# Patient Record
Sex: Female | Born: 1960 | Race: White | Hispanic: No | Marital: Married | Smoking: Never smoker
Health system: Southern US, Community
[De-identification: ages and names within clinical notes are randomized; demographics above are authoritative.]

## PROBLEM LIST (undated history)

## (undated) DIAGNOSIS — E119 Type 2 diabetes mellitus without complications: Secondary | ICD-10-CM

## (undated) DIAGNOSIS — K861 Other chronic pancreatitis: Secondary | ICD-10-CM

## (undated) DIAGNOSIS — F419 Anxiety disorder, unspecified: Secondary | ICD-10-CM

## (undated) DIAGNOSIS — F32A Depression, unspecified: Secondary | ICD-10-CM

## (undated) DIAGNOSIS — I10 Essential (primary) hypertension: Secondary | ICD-10-CM

## (undated) HISTORY — PX: TUBAL LIGATION: SHX77

## (undated) HISTORY — PX: CHOLECYSTECTOMY: SHX55

## (undated) HISTORY — PX: GASTRIC BYPASS: SHX52

## (undated) HISTORY — PX: CARPAL TUNNEL RELEASE: SHX101

---

## 2005-08-28 ENCOUNTER — Emergency Department: Payer: Self-pay | Admitting: Emergency Medicine

## 2005-09-04 ENCOUNTER — Emergency Department: Payer: Self-pay | Admitting: Emergency Medicine

## 2005-09-06 ENCOUNTER — Emergency Department: Payer: Self-pay | Admitting: Emergency Medicine

## 2020-07-11 ENCOUNTER — Ambulatory Visit (INDEPENDENT_AMBULATORY_CARE_PROVIDER_SITE_OTHER): Payer: No Typology Code available for payment source

## 2020-07-11 ENCOUNTER — Ambulatory Visit
Admission: EM | Admit: 2020-07-11 | Discharge: 2020-07-11 | Disposition: A | Discharge status: Home / Self Care | Payer: No Typology Code available for payment source | Attending: Physician Assistant | Admitting: Physician Assistant

## 2020-07-11 ENCOUNTER — Encounter: Payer: Self-pay | Admitting: Emergency Medicine

## 2020-07-11 ENCOUNTER — Other Ambulatory Visit: Payer: Self-pay

## 2020-07-11 DIAGNOSIS — M5441 Lumbago with sciatica, right side: Secondary | ICD-10-CM

## 2020-07-11 DIAGNOSIS — S3992XA Unspecified injury of lower back, initial encounter: Secondary | ICD-10-CM | POA: Diagnosis not present

## 2020-07-11 DIAGNOSIS — W19XXXA Unspecified fall, initial encounter: Secondary | ICD-10-CM

## 2020-07-11 DIAGNOSIS — M545 Low back pain, unspecified: Secondary | ICD-10-CM

## 2020-07-11 HISTORY — DX: Other chronic pancreatitis: K86.1

## 2020-07-11 HISTORY — DX: Depression, unspecified: F32.A

## 2020-07-11 HISTORY — DX: Type 2 diabetes mellitus without complications: E11.9

## 2020-07-11 HISTORY — DX: Essential (primary) hypertension: I10

## 2020-07-11 HISTORY — DX: Anxiety disorder, unspecified: F41.9

## 2020-07-11 MED ORDER — CYCLOBENZAPRINE HCL 10 MG PO TABS: 10.0000 mg | ORAL_TABLET | Freq: Three times a day (TID) | ORAL | 0 refills | Status: AC | PRN

## 2020-07-11 MED ORDER — MELOXICAM 7.5 MG PO TABS: 7.5000 mg | ORAL_TABLET | Freq: Every day | ORAL | 0 refills | Status: AC

## 2020-07-11 NOTE — ED Triage Notes (Signed)
Patient in today c/o back pain (lower middle) and right leg pain x 5 days. Patient states her back feels numb and she is having trouble putting weight on her right leg and difficulty straightening her leg out. Patient denies any radiating pain from her back to her leg. Patient has been using OTC Tylenol without relief. Patient states she fell ~2 weeks ago down 3 steps landing on her back.

## 2020-07-11 NOTE — ED Provider Notes (Signed)
[facility]    CSN: [id number] Arrival date & time: 07/11/20  1058      History   Chief Complaint Chief Complaint  Patient presents with  . Appointment  . Back Pain    Lower middle  . Leg Pain    right    HPI Amanda Lamb is a 60 y.o. female presenting for 5 day history of midline lower back pain and numbness, right posterior and lateral leg pain, and right great toe numbness. She reports slip and fall onto back 2 weeks ago. Has taken OTC Tylenol without relief or improvement in pain.  Patient states she has had problems with her sciatic nerve in the past but does not believe that her current symptoms are consistent with that.  She states that she thought her knee was hurting but then realized it was more of her leg from the knee.  Patient admits to increased pain in the leg and back whenever she extends any intrinsic leg out.  She also has some pain whenever she extends her back.  Pain is worse when she lays on her back.  She has not found anything to relieve the pain.  Pain worse with position changes and going from sitting to standing.  Denies any weakness.  No saddle anesthesia, bowel or bladder incontinence or leg weakness.  No other complaints or concerns.  HPI  Past Medical History:  Diagnosis Date  . Anxiety   . Chronic pancreatitis (HCC)   . Depression   . Diabetes mellitus without complication (HCC)   . Hypertension     There are no problems to display for this patient.   Past Surgical History:  Procedure Laterality Date  . CARPAL TUNNEL RELEASE Bilateral   . CESAREAN SECTION    . CHOLECYSTECTOMY    . GASTRIC BYPASS    . TUBAL LIGATION      OB History   No obstetric history on file.      Home Medications    Prior to Admission medications   Medication Sig Start Date End Date Taking? Authorizing Provider  ADMELOG SOLOSTAR 100 UNIT/ML KwikPen Inject into the skin. 06/16/20  Yes [provider]  ARIPiprazole (ABILIFY) 10 MG tablet  Take 10 mg by mouth daily. 07/06/20  Yes [provider]  atenolol (TENORMIN) 25 MG tablet Take 25 mg by mouth daily. 06/07/20  Yes [provider]  CREON 36000-114000 units CPEP capsule Take 72,000 Units by mouth 4 (four) times daily. 05/12/20  Yes [provider]  cyclobenzaprine (FLEXERIL) 10 MG tablet Take 1 tablet (10 mg total) by mouth 3 (three) times daily as needed for up to 10 days for muscle spasms. 07/11/20 07/21/20 Yes Shirlee Latch, PA-C  DULoxetine (CYMBALTA) 60 MG capsule Take 80 mg by mouth 2 (two) times daily. 07/06/20  Yes [provider]  famotidine (PEPCID) 20 MG tablet Take 20 mg by mouth 2 (two) times daily. 05/12/20  Yes [provider]  FEROSUL 325 (65 Fe) MG tablet Take 325 mg by mouth every morning. 05/12/20  Yes [provider]  furosemide (LASIX) 20 MG tablet Take 20 mg by mouth daily. 06/30/20  Yes [provider]  hydrALAZINE (APRESOLINE) 25 MG tablet Take 25 mg by mouth 2 (two) times daily. 06/30/20  Yes [provider]  insulin glargine (SEMGLEE) 100 UNIT/ML Solostar Pen INJECT 25 UNITS SUBCUTANEOUSLY NIGHTLY 05/14/20  Yes [provider]  meloxicam (MOBIC) 7.5 MG tablet Take 1 tablet (7.5 mg total) by  mouth daily. 07/11/20 08/10/20 Yes Eusebio FriendlyEaves, Noel Rodier B, PA-C  mirtazapine (REMERON) 30 MG tablet Take 30 mg by mouth at bedtime. 06/30/20  Yes [provider]  olmesartan (BENICAR) 40 MG tablet Take 40 mg by mouth daily. 07/05/20  Yes [provider]  pantoprazole (PROTONIX) 40 MG tablet Take 40 mg by mouth daily. 07/05/20  Yes [provider]  potassium chloride SA (KLOR-CON) 20 MEQ tablet Take 2 tablets by mouth 2 (two) times daily. 05/12/20  Yes [provider]  rOPINIRole (REQUIP) 0.5 MG tablet Take 0.5 mg by mouth 2 (two) times daily. 07/05/20  Yes [provider]  TRULICITY 1.5 MG/0.5ML SOPN Inject into the skin. 07/09/20  Yes [provider]     Family History Family History  Problem Relation Age of Onset  . Hypertension Mother   . Atrial fibrillation Mother   . Diabetes Mother   . Other Father        rare blood disease  . Heart disease Father   . Hypertension Father   . Atrial fibrillation Father     Social History Social History   Tobacco Use  . Smoking status: Never Smoker  . Smokeless tobacco: Never Used  Vaping Use  . Vaping Use: Never used  Substance Use Topics  . Alcohol use: Never  . Drug use: Never     Allergies   Gluten meal, Indomethacin, Lactose intolerance (gi), Lamotrigine, Lisinopril, Metformin hcl, Oxycodone, Oxycodone-acetaminophen, Oxytocin, Silver, Sulfa antibiotics, and Tape   Review of Systems Review of Systems  Constitutional: Negative for fatigue and fever.  Musculoskeletal: Positive for back pain. Negative for joint swelling and myalgias.  Skin: Negative for wound.  Neurological: Positive for numbness. Negative for weakness.  Hematological: Does not bruise/bleed easily.     Physical Exam Triage Vital Signs ED Triage Vitals  Enc Vitals Group     BP 07/11/20 1139 (!) 146/75     Pulse Rate 07/11/20 1139 71     Resp 07/11/20 1139 18     Temp 07/11/20 1139 98.3 F (36.8 C)     Temp Source 07/11/20 1139 Oral     SpO2 07/11/20 1139 99 %     Weight 07/11/20 1139 260 lb (117.9 kg)     Height 07/11/20 1139 5\' 7"  (1.702 m)     Head Circumference --      Peak Flow --      Pain Score 07/11/20 1138 9     Pain Loc --      Pain Edu? --      Excl. in GC? --    No data found.  Updated Vital Signs BP (!) 146/75 (BP Location: Left Arm)   Pulse 71   Temp 98.3 F (36.8 C) (Oral)   Resp 18   Ht 5\' 7"  (1.702 m)   Wt 260 lb (117.9 kg)   SpO2 99%   BMI 40.72 kg/m    Physical Exam Vitals and nursing note reviewed.  Constitutional:      General: She is not in acute distress.    Appearance: Normal appearance. She is obese. She is not ill-appearing or toxic-appearing.  HENT:      Head: Normocephalic and atraumatic.  Eyes:     General: No scleral icterus.       Right eye: No discharge.        Left eye: No discharge.     Conjunctiva/sclera: Conjunctivae normal.  Cardiovascular:     Rate and Rhythm: Normal rate and regular  rhythm.     Heart sounds: Normal heart sounds.  Pulmonary:     Effort: Pulmonary effort is normal. No respiratory distress.     Breath sounds: Normal breath sounds.  Musculoskeletal:     Cervical back: Neck supple.     Lumbar back: Spasms and bony tenderness (L4-S1) present. Decreased range of motion (decreased extension of back). Positive right straight leg raise test. Negative left straight leg raise test.  Skin:    General: Skin is dry.  Neurological:     General: No focal deficit present.     Mental Status: She is alert. Mental status is at baseline.     Motor: No weakness.     Gait: Gait normal.  Psychiatric:        Mood and Affect: Mood normal.        Behavior: Behavior normal.        Thought Content: Thought content normal.      UC Treatments / Results  Labs (all labs ordered are listed, but only abnormal results are displayed) Labs Reviewed - No data to display  EKG   Radiology DG Lumbar Spine Complete  Result Date: 07/11/2020 CLINICAL DATA:  Status post fall with back pain. EXAM: LUMBAR SPINE - COMPLETE 4+ VIEW COMPARISON:  None. FINDINGS: There is no evidence of lumbar spine fracture. Alignment is normal. Degenerative joint changes of the spine are noted. IMPRESSION: No acute fracture or dislocation. Electronically Signed   By: Sherian Rein M.D.   On: 07/11/2020 12:17    Procedures Procedures (including critical care time)  Medications Ordered in UC Medications - No data to display  Initial Impression / Assessment and Plan / UC Course  I have reviewed the triage vital signs and the nursing notes.  Pertinent labs & imaging results that were available during my care of the patient were reviewed by me and  considered in my medical decision making (see chart for details).  X-ray of L-spine obtained today since patient does report traumatic fall couple weeks ago prior to onset of pain.  X-rays negative for any fractures or acute abnormality.  It does show some degenerative joint changes throughout the spine.  Discussed results with patient.  Advised patient I suspect herniated or bulging disc with lumbar radiculopathy.  Treating at this time with supportive care and meloxicam 7.5 mg tablet daily, Tylenol daily as needed for pain relief, and cyclobenzaprine 3 times daily as needed for muscle spasms and pain.  Patient says she has taken cyclobenzaprine in the past and has done well with that.  Patient given contact information for orthopedics in case symptoms worsen.  I reviewed ED precautions with patient.   Final Clinical Impressions(s) / UC Diagnoses   Final diagnoses:  Acute midline low back pain with right-sided sciatica  Injury of back, initial encounter     Discharge Instructions     The back x-ray is normal.  BACK PAIN: Stressed avoiding painful activities . RICE (REST, ICE, COMPRESSION, ELEVATION) guidelines reviewed. May alternate ice and heat. Consider use of muscle rubs, Salonpas patches, etc. Use medications as directed including muscle relaxers if prescribed. Take anti-inflammatory medications as prescribed or OTC NSAIDs/Tylenol.  F/u with PCP in 7-10 days for reexamination, and please feel free to call or return to the urgent care at any time for any questions or concerns you may have and we will be happy to help you!   BACK PAIN RED FLAGS: If the back pain acutely worsens or there are any  red flag symptoms such as numbness/tingling, leg weakness, saddle anesthesia, or loss of bowel/bladder control, go immediately to the ER. Follow up with [place] as scheduled or sooner if the pain does not begin to resolve or if it worsens before the follow up    You have a condition requiring you to follow  up with Orthopedics so please call one of the following office for appointment:   [facility] [address] [place], [place] [address] Phone: [phone]  [facility] [address] [place], [place] [address] Phone: [phone]     ED Prescriptions    Medication Sig Dispense Auth. Provider   meloxicam (MOBIC) 7.5 MG tablet Take 1 tablet (7.5 mg total) by mouth daily. 30 tablet Eusebio Friendly B, PA-C   cyclobenzaprine (FLEXERIL) 10 MG tablet Take 1 tablet (10 mg total) by mouth 3 (three) times daily as needed for up to 10 days for muscle spasms. 20 tablet Shirlee Latch, PA-C     I have reviewed the PDMP during this encounter.   Shirlee Latch, PA-C 07/11/20 1311

## 2020-07-11 NOTE — Discharge Instructions (Addendum)
The back x-ray is normal.  BACK PAIN: Stressed avoiding painful activities . RICE (REST, ICE, COMPRESSION, ELEVATION) guidelines reviewed. May alternate ice and heat. Consider use of muscle rubs, Salonpas patches, etc. Use medications as directed including muscle relaxers if prescribed. Take anti-inflammatory medications as prescribed or OTC NSAIDs/Tylenol.  F/u with PCP in 7-10 days for reexamination, and please feel free to call or return to the urgent care at any time for any questions or concerns you may have and we will be happy to help you!   BACK PAIN RED FLAGS: If the back pain acutely worsens or there are any red flag symptoms such as numbness/tingling, leg weakness, saddle anesthesia, or loss of bowel/bladder control, go immediately to the ER. Follow up with [place] as scheduled or sooner if the pain does not begin to resolve or if it worsens before the follow up    You have a condition requiring you to follow up with Orthopedics so please call one of the following office for appointment:   [facility] [address] [place], [place] [address] Phone: [phone]  [facility] [address] [place], [place] [address] Phone: [phone]

## 2021-10-21 IMAGING — CR DG LUMBAR SPINE COMPLETE 4+V
5 series · 5 of 5 positions shown · non-contrast
Comparison: None.

CLINICAL DATA: Status post fall with back pain.

EXAM:
LUMBAR SPINE - COMPLETE 4+ VIEW

[l-spine ap]
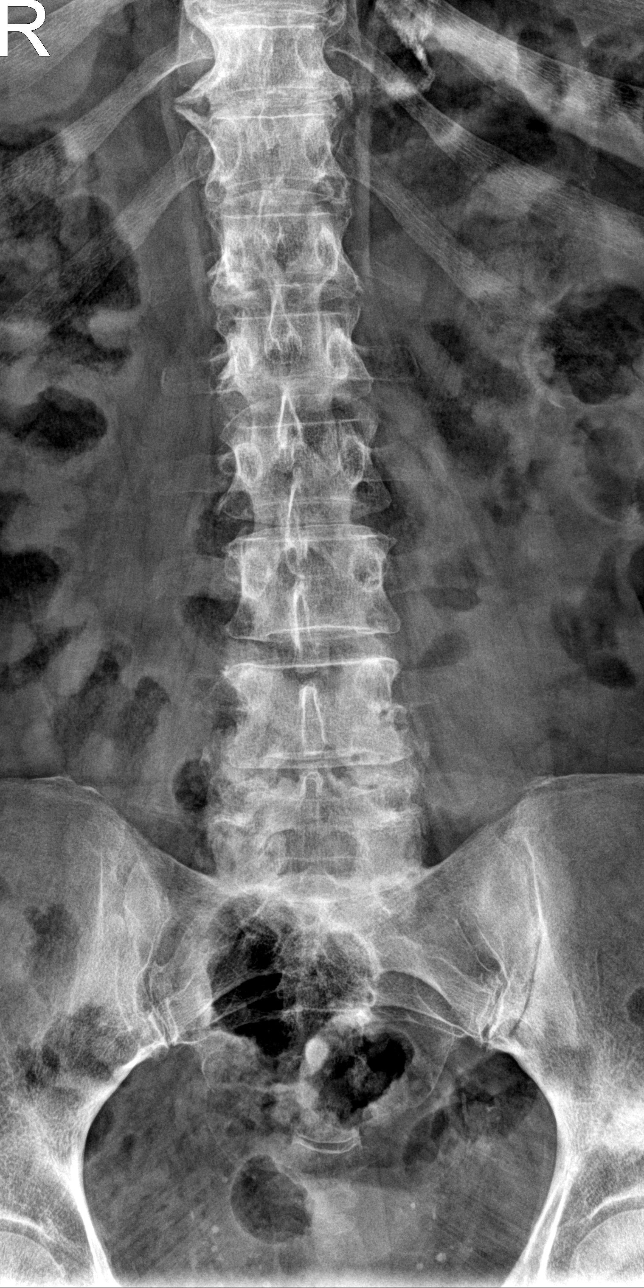

[l-spine obl (1 of 2)]
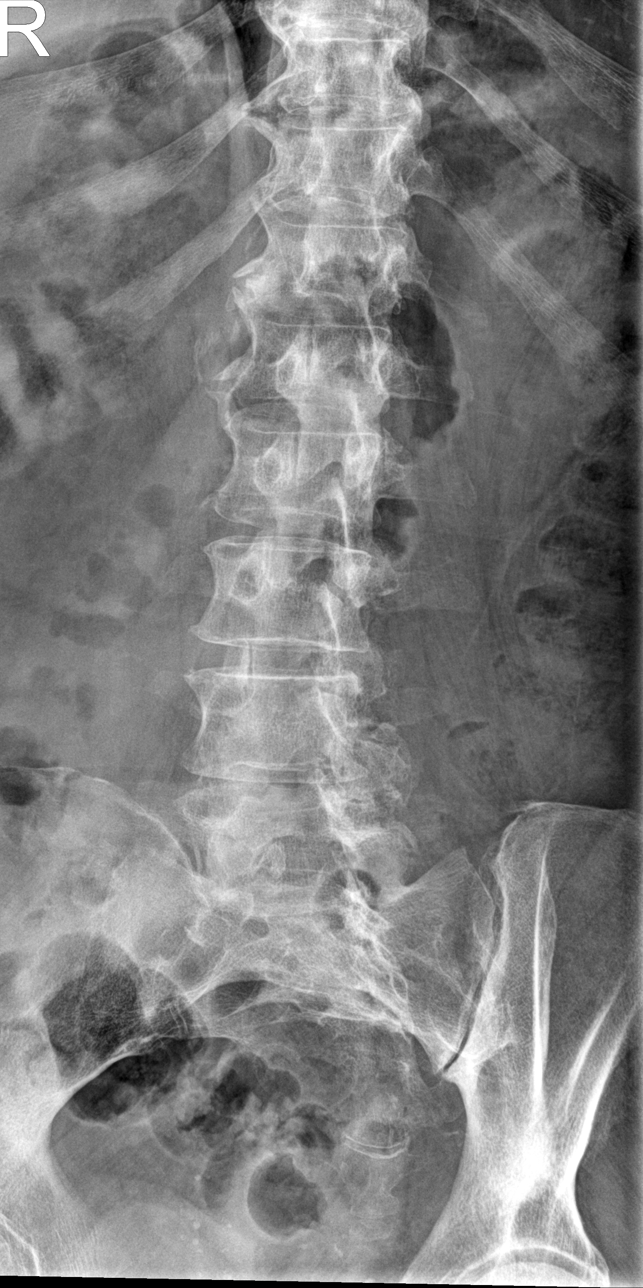

[l-spine obl (2 of 2)]
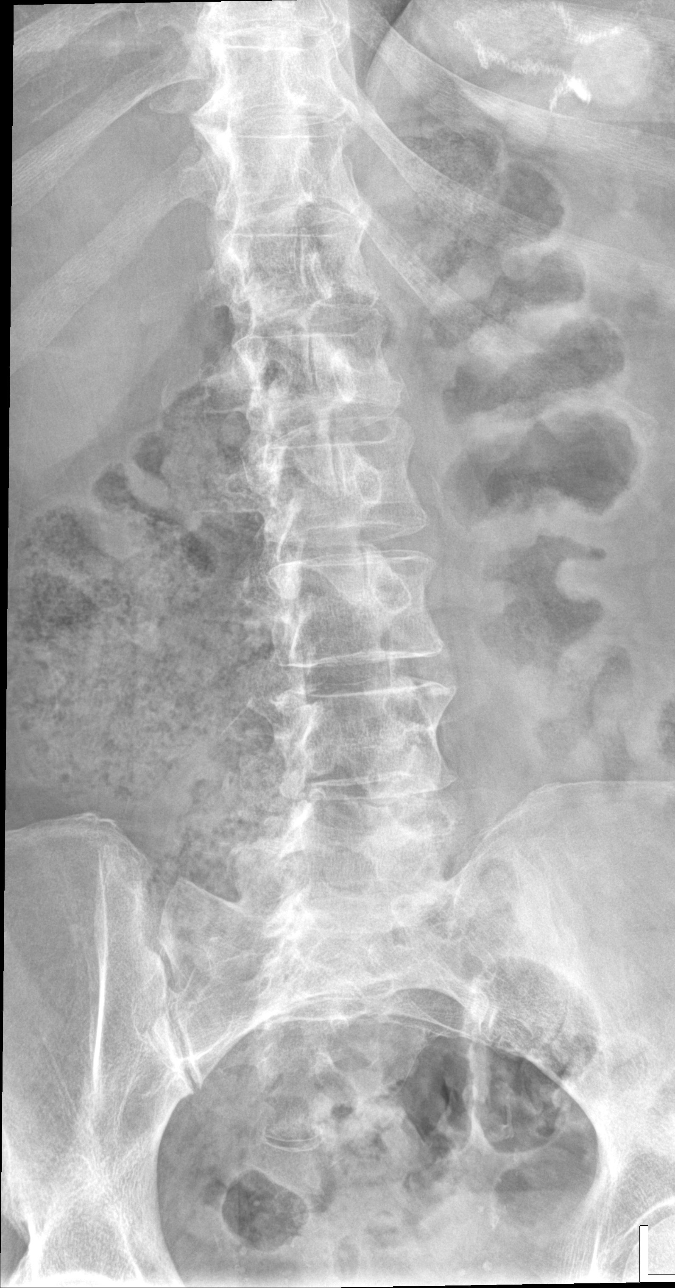

[l-spine lat]
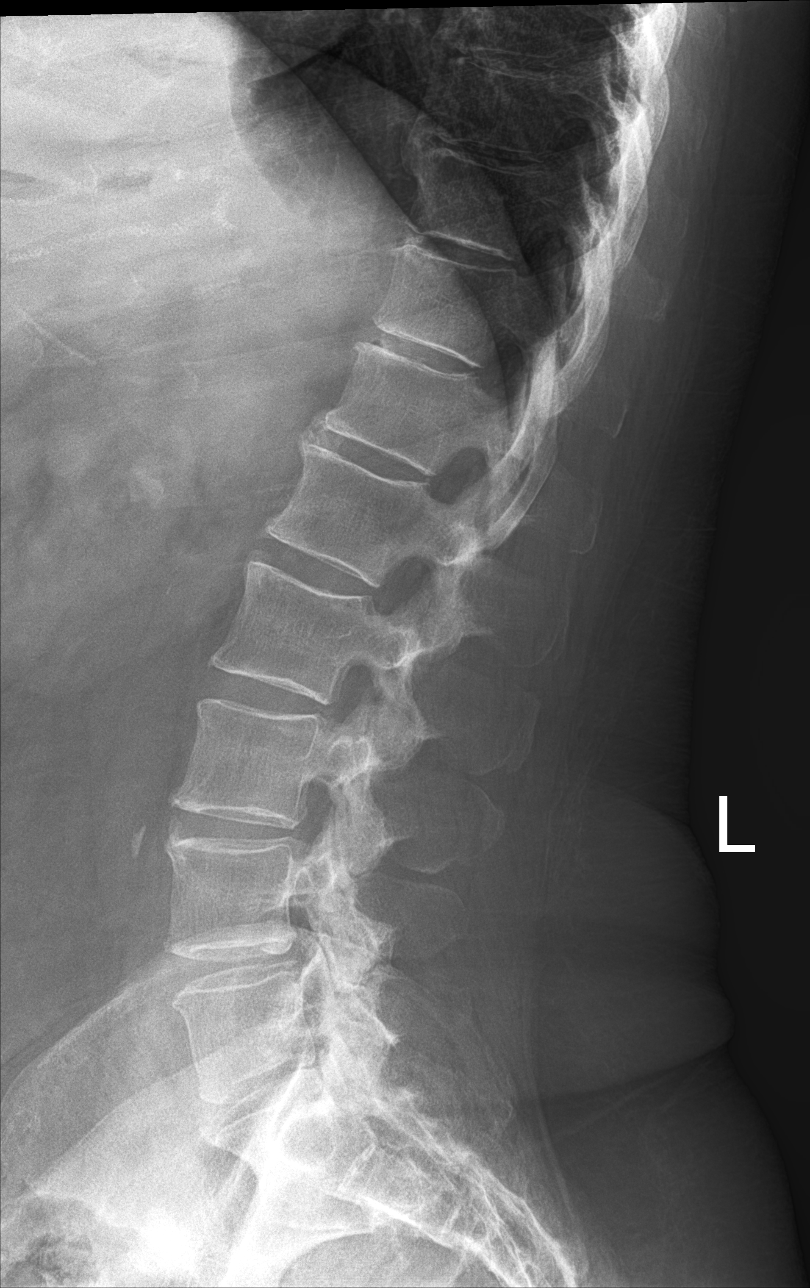

[l-spine spot]
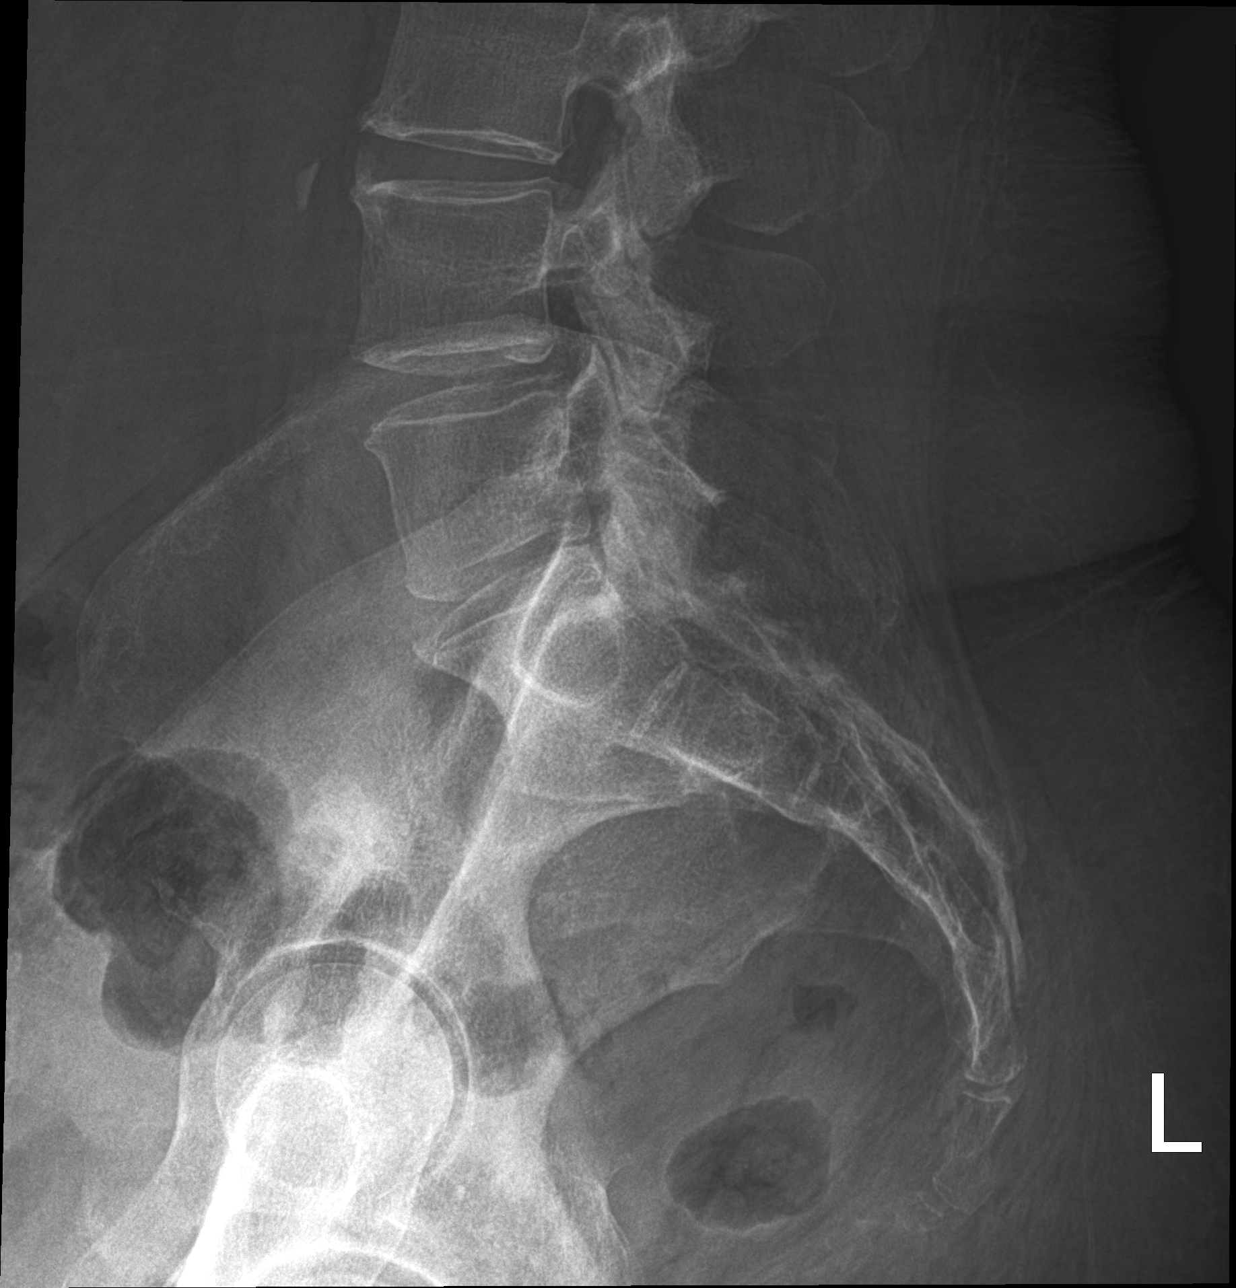

[5 of 5 positions shown; findings below may reference images not displayed]

FINDINGS: There is no evidence of lumbar spine fracture. Alignment is normal.
Degenerative joint changes of the spine are noted.
IMPRESSION: No acute fracture or dislocation.

## 2022-04-13 ENCOUNTER — Ambulatory Visit
Admission: RE | Admit: 2022-04-13 | Discharge: 2022-04-13 | Disposition: A | Discharge status: Home / Self Care | Payer: No Typology Code available for payment source | Source: Ambulatory Visit

## 2022-04-13 ENCOUNTER — Ambulatory Visit: Payer: No Typology Code available for payment source

## 2022-04-13 VITALS — BP 162/90 | HR 80 | Temp 98.8°F | Ht 67.0 in | Wt 260.0 lb

## 2022-04-13 DIAGNOSIS — R310 Gross hematuria: Secondary | ICD-10-CM

## 2022-04-13 DIAGNOSIS — R109 Unspecified abdominal pain: Secondary | ICD-10-CM | POA: Diagnosis not present

## 2022-04-13 DIAGNOSIS — M545 Low back pain, unspecified: Secondary | ICD-10-CM

## 2022-04-13 DIAGNOSIS — N390 Urinary tract infection, site not specified: Secondary | ICD-10-CM | POA: Diagnosis not present

## 2022-04-13 LAB — URINALYSIS, ROUTINE W REFLEX MICROSCOPIC
Bilirubin Urine: NEGATIVE
Glucose, UA: NEGATIVE mg/dL
Hgb urine dipstick: NEGATIVE
Ketones, ur: NEGATIVE mg/dL
Nitrite: NEGATIVE
Protein, ur: NEGATIVE mg/dL
Specific Gravity, Urine: <1.005 — ABNORMAL LOW (ref 1.005–1.030)
pH: 5.5 (ref 5.0–8.0)

## 2022-04-13 LAB — URINALYSIS, MICROSCOPIC (REFLEX)

## 2022-04-13 MED ORDER — PHENAZOPYRIDINE HCL 200 MG PO TABS: 200.0000 mg | ORAL_TABLET | Freq: Three times a day (TID) | ORAL | 0 refills | Status: AC

## 2022-04-13 MED ORDER — CEFDINIR 300 MG PO CAPS: 300.0000 mg | ORAL_CAPSULE | Freq: Two times a day (BID) | ORAL | 0 refills | Status: AC

## 2022-04-13 MED ORDER — BACLOFEN 10 MG PO TABS: 10.0000 mg | ORAL_TABLET | Freq: Three times a day (TID) | ORAL | 0 refills | Status: AC

## 2022-04-13 NOTE — ED Triage Notes (Signed)
Pt c/o RT lower back pain x couple months, pt denies any fall or injury

## 2022-04-13 NOTE — Discharge Instructions (Addendum)
Take the cefdinir twice daily for 7 days with food for treatment of urinary tract infection.  Use the Pyridium every 8 hours as needed for urinary discomfort.  This will turn your urine a bright red-orange.  Increase your oral fluid intake so that you increase your urine production and or flushing your urinary system.  Take an over-the-counter probiotic, such as Culturelle-Align-Activia, 1 hour after each dose of antibiotic to prevent diarrhea or yeast infections from forming.  We will culture urine and change the antibiotics if necessary.  There may also be a musculoskeletal component considering her back pain started multiple weeks before your urinary symptoms.  Take Tylenol 1000 mg every 6 hours to help with the pain.  Take the baclofen every 8 hours to help with muscle pain.  Return for reevaluation, or see your primary care provider, for any new or worsening symptoms.

## 2022-04-13 NOTE — ED Provider Notes (Signed)
[facility]    CSN: [id number] Arrival date & time: 04/13/22  1851      History   Chief Complaint Chief Complaint  Patient presents with   Back Pain    HPI Amanda Lamb is a 61 y.o. female.   HPI  61 year old female here for evaluation of right flank pain.  Patient reports that she has been experiencing pain in her right flank and the right side of her back for the past 2 months.  The pain does increase at nighttime and also increases with movement.  She is also been experiencing burning with urination and urinary urgency and frequency for the past 2 weeks.  Today she noticed some blood in her urine.  This is associated with nausea when the pain is severe as well as some sweating.  She denies any injury or heavy lifting.  The pain does not radiate into her legs at all.  She has no history of kidney stones.  She is a resident of [place] and she is currently in town visiting her mother.  Patient has a significant past medical history to include hypertension, diabetes, depression, anxiety, and chronic pancreatitis.  Past Medical History:  Diagnosis Date   Anxiety    Chronic pancreatitis ([place])    Depression    Diabetes mellitus without complication ([place])    Hypertension     There are no problems to display for this patient.   Past Surgical History:  Procedure Laterality Date   CARPAL TUNNEL RELEASE Bilateral    CESAREAN SECTION     CHOLECYSTECTOMY     GASTRIC BYPASS     TUBAL LIGATION      OB History   No obstetric history on file.      Home Medications    Prior to Admission medications   Medication Sig Start Date End Date Taking? Authorizing Provider  ADMELOG SOLOSTAR 100 UNIT/ML KwikPen Inject into the skin. 06/16/20  Yes [provider]  albuterol (VENTOLIN HFA) 108 (90 Base) MCG/ACT inhaler Inhale into the lungs. 06/14/21  Yes [provider]  baclofen (LIORESAL) 10 MG tablet Take 1 tablet (10 mg total) by mouth 3 (three)  times daily. 04/13/22  Yes Margarette Canada, NP  cefdinir (OMNICEF) 300 MG capsule Take 1 capsule (300 mg total) by mouth 2 (two) times daily for 7 days. 04/13/22 04/20/22 Yes Margarette Canada, NP  DULoxetine (CYMBALTA) 60 MG capsule Take by mouth. 11/12/13  Yes [provider]  hydrALAZINE (APRESOLINE) 25 MG tablet Take 1 tablet by mouth 2 (two) times daily. 06/14/21  Yes [provider]  levothyroxine (SYNTHROID) 50 MCG tablet Take 1 tablet by mouth daily. 03/15/22  Yes [provider]  olmesartan (BENICAR) 40 MG tablet Take 1 tablet by mouth daily. 06/14/21  Yes [provider]  phenazopyridine (PYRIDIUM) 200 MG tablet Take 1 tablet (200 mg total) by mouth 3 (three) times daily. 04/13/22  Yes Margarette Canada, NP  ARIPiprazole (ABILIFY) 10 MG tablet Take 10 mg by mouth daily. 07/06/20   [provider]  atenolol (TENORMIN) 25 MG tablet Take 25 mg by mouth daily. 06/07/20   [provider]  CREON 36000-114000 units CPEP capsule Take 72,000 Units by mouth 4 (four) times daily. 05/12/20   [provider]  DULoxetine (CYMBALTA) 60 MG capsule Take 80 mg by mouth 2 (two) times daily. 07/06/20   [provider]  famotidine (PEPCID) 20 MG tablet Take 20 mg by mouth 2 (two) times daily. 05/12/20  [provider]  FEROSUL 325 (65 Fe) MG tablet Take 325 mg by mouth every morning. 05/12/20   [provider]  furosemide (LASIX) 20 MG tablet Take 20 mg by mouth daily. 06/30/20   [provider]  hydrALAZINE (APRESOLINE) 25 MG tablet Take 25 mg by mouth 2 (two) times daily. 06/30/20   [provider]  insulin glargine (SEMGLEE) 100 UNIT/ML Solostar Pen INJECT 25 UNITS SUBCUTANEOUSLY NIGHTLY 05/14/20   [provider]  mirtazapine (REMERON) 30 MG tablet Take 30 mg by mouth at bedtime. 06/30/20   [provider]  olmesartan (BENICAR) 40 MG tablet Take 40 mg by mouth daily. 07/05/20   [provider]   pantoprazole (PROTONIX) 40 MG tablet Take 40 mg by mouth daily. 07/05/20   [provider]  potassium chloride SA (KLOR-CON) 20 MEQ tablet Take 2 tablets by mouth 2 (two) times daily. 05/12/20   [provider]  rOPINIRole (REQUIP) 0.5 MG tablet Take 0.5 mg by mouth 2 (two) times daily. 07/05/20   [provider]  TRULICITY 1.5 MG/0.5ML SOPN Inject into the skin. 07/09/20   [provider]    Family History Family History  Problem Relation Age of Onset   Hypertension Mother    Atrial fibrillation Mother    Diabetes Mother    Other Father        rare blood disease   Heart disease Father    Hypertension Father    Atrial fibrillation Father     Social History Social History   Tobacco Use   Smoking status: Never   Smokeless tobacco: Never  Vaping Use   Vaping Use: Never used  Substance Use Topics   Alcohol use: Never   Drug use: Never     Allergies   Lactose, Oxycodone hcl, Gluten meal, Indomethacin, Lactose intolerance (gi), Lamotrigine, Lisinopril, Metformin hcl, Oxycodone, Oxycodone-acetaminophen, Oxytocin, Silver, Sulfa antibiotics, and Tape   Review of Systems Review of Systems  Constitutional:  Positive for diaphoresis. Negative for fever.  Gastrointestinal:  Positive for nausea.  Genitourinary:  Positive for dysuria, flank pain, frequency, hematuria and urgency.  Musculoskeletal:  Positive for back pain.  Neurological: Negative.   Hematological: Negative.   Psychiatric/Behavioral: Negative.       Physical Exam Triage Vital Signs ED Triage Vitals  Enc Vitals Group     BP 04/13/22 1859 (!) 162/90     Pulse Rate 04/13/22 1859 80     Resp --      Temp 04/13/22 1859 98.8 F (37.1 C)     Temp Source 04/13/22 1859 Oral     SpO2 04/13/22 1859 97 %     Weight 04/13/22 1856 260 lb (117.9 kg)     Height 04/13/22 1856 5\' 7"  (1.702 m)     Head Circumference --      Peak Flow --      Pain Score 04/13/22 1856 5     Pain Loc --       Pain Edu? --      Excl. in GC? --    No data found.  Updated Vital Signs BP (!) 162/90 (BP Location: Left Arm)   Pulse 80   Temp 98.8 F (37.1 C) (Oral)   Ht 5\' 7"  (1.702 m)   Wt 260 lb (117.9 kg)   SpO2 97%   BMI 40.72 kg/m   Visual Acuity Right Eye Distance:   Left Eye Distance:   Bilateral Distance:    Right Eye Near:  Left Eye Near:    Bilateral Near:     Physical Exam Vitals and nursing note reviewed.  Constitutional:      Appearance: Normal appearance. She is not ill-appearing.  HENT:     Head: Normocephalic and atraumatic.  Cardiovascular:     Rate and Rhythm: Normal rate and regular rhythm.     Pulses: Normal pulses.     Heart sounds: Normal heart sounds. No murmur heard.    No friction rub. No gallop.  Pulmonary:     Effort: Pulmonary effort is normal.     Breath sounds: Normal breath sounds. No wheezing, rhonchi or rales.  Abdominal:     General: Abdomen is flat.     Palpations: Abdomen is soft.     Tenderness: There is right CVA tenderness. There is no left CVA tenderness.  Skin:    General: Skin is warm and dry.     Capillary Refill: Capillary refill takes less than 2 seconds.     Findings: No erythema or rash.  Neurological:     General: No focal deficit present.     Mental Status: She is alert and oriented to person, place, and time.  Psychiatric:        Mood and Affect: Mood normal.        Behavior: Behavior normal.        Thought Content: Thought content normal.        Judgment: Judgment normal.      UC Treatments / Results  Labs (all labs ordered are listed, but only abnormal results are displayed) Labs Reviewed  URINALYSIS, ROUTINE W REFLEX MICROSCOPIC - Abnormal; Notable for the following components:      Result Value   Color, Urine STRAW (*)    Specific Gravity, Urine <1.005 (*)    Leukocytes,Ua MODERATE (*)    All other components within normal limits  URINALYSIS, MICROSCOPIC (REFLEX) - Abnormal; Notable for the following  components:   Bacteria, UA MANY (*)    All other components within normal limits  URINE CULTURE    EKG   Radiology DG Abdomen 1 View  Result Date: 04/13/2022 CLINICAL DATA:  Right flank and back pain, gross hematuria EXAM: ABDOMEN - 1 VIEW COMPARISON:  07/11/2020 FINDINGS: Four supine frontal views of the abdomen and pelvis are obtained. Bowel gas pattern is unremarkable without obstruction or ileus. Multiple phleboliths overlie the pelvis. No abdominal masses or abnormal calcifications. Postsurgical changes within the stomach. Lung bases are clear. No acute bony abnormalities. Stable lumbar degenerative changes. Stable left iliac bone most consistent with bone island. Changes of osteitis pubis. IMPRESSION: 1. Unremarkable bowel gas pattern. 2. No radiopaque urinary tract calculi. Electronically Signed   By: Sharlet Salina M.D.   On: 04/13/2022 19:49    Procedures Procedures (including critical care time)  Medications Ordered in UC Medications - No data to display  Initial Impression / Assessment and Plan / UC Course  I have reviewed the triage vital signs and the nursing notes.  Pertinent labs & imaging results that were available during my care of the patient were reviewed by me and considered in my medical decision making (see chart for details).   Patient is a nontoxic-appearing 61 year old female here for evaluation of 2 months with a right flank pain with 2 weeks worth of dysuria, urgency, and frequency.  She also reports that she did notice some hematuria today.  She denies any history of kidney stones but reports that when her pain is severe  she will experience nausea and sweats.  She denies any heavy lifting or injury.  The pain does not radiate anywhere.  It does increase with movement and also increases at nighttime.  On exam patient does have CVA tenderness on her right.  Cardiopulmonary exam is benign.  I will order a urinalysis to look for the presence of infection and also  blood possible kidney stone.  I will also order a KUB to look for the presence of a stone.  If her urinalysis and KUB are negative I will treat the patient for musculoskeletal back pain.  Urinalysis shows moderate leukocyte esterase but is negative for nitrates, protein, or ketones.  Also no hemoglobin or glucose.  Specific gravity is less than 1.005.  Reflex microscopy shows 11-20 WBCs with many bacteria and WBC clumps present.  I will send urine for culture.  KUB independently reviewed and evaluated by me.  Impression: There is a questionable radiopaque lucency in the body of the right kidney.  No other lucencies along the expected path of the ureters.  No lucencies noted in the left kidney.  Nonspecific bowel gas pattern.  Multiple phleboliths in the pelvic floor.  Radiology overread is pending. Radiology impression states that there is no radiopaque urinary tract calculi.  Multiple phleboliths overlie the pelvis.  No abdominal masses or abnormal calcifications.  I will discharge patient home with a diagnosis of urinary tract infection and start her on cefdinir twice daily for 7 days.  I will also have her use Tylenol to help with the discomfort and baclofen as there may also be a musculoskeletal component since her urinary symptoms have not been present as long as her back pain.  Care everywhere has a CMP from 03/02/2022 that shows normal renal function with a BUN of 18 and a creatinine of 0.81.  Her GFR is greater than 60.   Final Clinical Impressions(s) / UC Diagnoses   Final diagnoses:  Lower urinary tract infectious disease  Acute right-sided low back pain without sciatica     Discharge Instructions      Take the cefdinir twice daily for 7 days with food for treatment of urinary tract infection.  Use the Pyridium every 8 hours as needed for urinary discomfort.  This will turn your urine a bright red-orange.  Increase your oral fluid intake so that you increase your urine production  and or flushing your urinary system.  Take an over-the-counter probiotic, such as Culturelle-Align-Activia, 1 hour after each dose of antibiotic to prevent diarrhea or yeast infections from forming.  We will culture urine and change the antibiotics if necessary.  There may also be a musculoskeletal component considering her back pain started multiple weeks before your urinary symptoms.  Take Tylenol 1000 mg every 6 hours to help with the pain.  Take the baclofen every 8 hours to help with muscle pain.  Return for reevaluation, or see your primary care provider, for any new or worsening symptoms.      ED Prescriptions     Medication Sig Dispense Auth. Provider   cefdinir (OMNICEF) 300 MG capsule Take 1 capsule (300 mg total) by mouth 2 (two) times daily for 7 days. 14 capsule Becky Augusta, NP   phenazopyridine (PYRIDIUM) 200 MG tablet Take 1 tablet (200 mg total) by mouth 3 (three) times daily. 6 tablet Becky Augusta, NP   baclofen (LIORESAL) 10 MG tablet Take 1 tablet (10 mg total) by mouth 3 (three) times daily. 30 each Becky Augusta, NP  I have reviewed the PDMP during this encounter.   Becky Augustayan, [place], NP 04/13/22 [phone]

## 2022-04-14 LAB — URINE CULTURE
